# Patient Record
Sex: Female | Born: 1975 | Race: White | Hispanic: No | Marital: Married | Smoking: Never smoker
Health system: Southern US, Community
[De-identification: ages and names within clinical notes are randomized; demographics above are authoritative.]

## PROBLEM LIST (undated history)

## (undated) DIAGNOSIS — N390 Urinary tract infection, site not specified: Secondary | ICD-10-CM

---

## 2000-06-14 ENCOUNTER — Other Ambulatory Visit: Admission: RE | Admit: 2000-06-14 | Discharge: 2000-06-14 | Payer: Self-pay | Admitting: Obstetrics and Gynecology

## 2001-07-24 ENCOUNTER — Other Ambulatory Visit: Admission: RE | Admit: 2001-07-24 | Discharge: 2001-07-24 | Payer: Self-pay | Admitting: Obstetrics and Gynecology

## 2002-05-14 ENCOUNTER — Emergency Department (HOSPITAL_COMMUNITY): Admission: EM | Admit: 2002-05-14 | Discharge: 2002-05-14 | Payer: Self-pay | Admitting: Emergency Medicine

## 2002-05-14 ENCOUNTER — Encounter: Payer: Self-pay | Admitting: Emergency Medicine

## 2002-08-28 ENCOUNTER — Other Ambulatory Visit: Admission: RE | Admit: 2002-08-28 | Discharge: 2002-08-28 | Payer: Self-pay | Admitting: Obstetrics and Gynecology

## 2004-01-26 ENCOUNTER — Other Ambulatory Visit: Admission: RE | Admit: 2004-01-26 | Discharge: 2004-01-26 | Payer: Self-pay | Admitting: Family Medicine

## 2004-05-17 ENCOUNTER — Emergency Department (HOSPITAL_COMMUNITY): Admission: EM | Admit: 2004-05-17 | Discharge: 2004-05-18 | Payer: Self-pay | Admitting: Emergency Medicine

## 2004-07-08 ENCOUNTER — Ambulatory Visit (HOSPITAL_COMMUNITY): Admission: RE | Admit: 2004-07-08 | Discharge: 2004-07-08 | Payer: Self-pay | Admitting: Obstetrics & Gynecology

## 2004-07-16 ENCOUNTER — Encounter (INDEPENDENT_AMBULATORY_CARE_PROVIDER_SITE_OTHER): Payer: Self-pay | Admitting: Specialist

## 2004-07-16 ENCOUNTER — Ambulatory Visit: Payer: Self-pay | Admitting: Obstetrics & Gynecology

## 2004-07-16 ENCOUNTER — Ambulatory Visit (HOSPITAL_COMMUNITY): Admission: RE | Admit: 2004-07-16 | Discharge: 2004-07-16 | Payer: Self-pay | Admitting: Obstetrics & Gynecology

## 2004-08-03 ENCOUNTER — Ambulatory Visit: Payer: Self-pay | Admitting: Obstetrics & Gynecology

## 2005-02-28 ENCOUNTER — Inpatient Hospital Stay (HOSPITAL_COMMUNITY): Admission: AD | Admit: 2005-02-28 | Discharge: 2005-02-28 | Payer: Self-pay | Admitting: Obstetrics and Gynecology

## 2005-05-14 ENCOUNTER — Inpatient Hospital Stay (HOSPITAL_COMMUNITY): Admission: AD | Admit: 2005-05-14 | Discharge: 2005-05-15 | Payer: Self-pay | Admitting: Obstetrics and Gynecology

## 2005-05-16 ENCOUNTER — Inpatient Hospital Stay (HOSPITAL_COMMUNITY): Admission: AD | Admit: 2005-05-16 | Discharge: 2005-05-18 | Payer: Self-pay | Admitting: Obstetrics and Gynecology

## 2005-05-16 ENCOUNTER — Encounter (INDEPENDENT_AMBULATORY_CARE_PROVIDER_SITE_OTHER): Payer: Self-pay | Admitting: Specialist

## 2005-05-19 ENCOUNTER — Encounter: Admission: RE | Admit: 2005-05-19 | Discharge: 2005-06-18 | Payer: Self-pay | Admitting: Obstetrics and Gynecology

## 2005-06-24 ENCOUNTER — Other Ambulatory Visit: Admission: RE | Admit: 2005-06-24 | Discharge: 2005-06-24 | Payer: Self-pay | Admitting: Obstetrics and Gynecology

## 2007-01-17 ENCOUNTER — Other Ambulatory Visit: Admission: RE | Admit: 2007-01-17 | Discharge: 2007-01-17 | Payer: Self-pay | Admitting: Family Medicine

## 2007-09-14 ENCOUNTER — Inpatient Hospital Stay (HOSPITAL_COMMUNITY): Admission: AD | Admit: 2007-09-14 | Discharge: 2007-09-14 | Payer: Self-pay | Admitting: Obstetrics and Gynecology

## 2007-09-26 ENCOUNTER — Inpatient Hospital Stay (HOSPITAL_COMMUNITY): Admission: AD | Admit: 2007-09-26 | Discharge: 2007-09-28 | Payer: Self-pay | Admitting: Obstetrics and Gynecology

## 2007-09-26 ENCOUNTER — Encounter (INDEPENDENT_AMBULATORY_CARE_PROVIDER_SITE_OTHER): Payer: Self-pay | Admitting: Obstetrics and Gynecology

## 2010-02-28 ENCOUNTER — Encounter: Payer: Self-pay | Admitting: Obstetrics & Gynecology

## 2010-06-22 NOTE — H&P (Signed)
[name], [name]                  ACCOUNT NO.:  [id number]   MEDICAL RECORD NO.:  [id number]           PATIENT TYPE:   LOCATION:                                 FACILITY:   PHYSICIAN:  Marie L. Williams, C.N.M.DATE OF BIRTH:  07/08/1975   DATE OF ADMISSION:  DATE OF DISCHARGE:                              HISTORY & PHYSICAL   This is a 35 year old gravida 3, para 1-0-1-1 at 38-5/7 weeks who  presents with complaints of contractions.  She denies leaking or  bleeding and reports positive fetal movement.  Pregnancy has been  followed by the MD Service and remarkable for;  1. 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  2. Rh negative.  3. Rubella nonimmune.  4. Group B strep negative.   ALLERGIES:  ERYTHROMYCIN.   OB HISTORY:  Remarkable for spontaneous abortion in 2006 and a vaginal  delivery in 2007 of female infant at [redacted] weeks gestation, weighing 6  pounds 13 ounces.  Remarkable for chronic fetal maternal hemorrhage.   MEDICAL HISTORY:  1. Remarkable for Rh negative.  2. Childhood varicella.  3. Rubella nonimmune.   SURGICAL HISTORY:  Remarkable for rhinoplasty in 1999 and D&C in 2006.   FAMILY HISTORY:  Remarkable for grandmother with MI, grandfather with  MI, grandmother and mother with hypertension, mother with thyroid cancer  and a grandmother with pancreatic cancer.   GENETIC HISTORY:  Remarkable for family history of twins.   SOCIAL HISTORY:  The patient is married to [employer] who is involved  and supportive.  She is of the [place] faith.  She denies any alcohol,  tobacco, or drug use.   PRENATAL LABS:  Hemoglobin 13.5, platelets 234, blood type O negative,  antibody screen negative, RPR nonreactive, rubella nonimmune, hepatitis  negative, HIV negative.  Pap test normal.  Cultures declined.   HISTORY OF CURRENT PREGNANCY:  The patient entered care at [redacted] weeks  gestation.  She declined first-trimester screen.  She had ultrasound at  18  weeks that was normal.  She was treated for UTI at that time.  Glucola at 28 weeks was normal.  She had an ultrasound at 32 weeks that  showed 70th percentile growth and normal AFI, and she was group B strep  negative at term.   OBJECTIVE DATA:  VITAL SIGNS:  Stable.  Afebrile.  HEENT:  Within normal limits.  Thyroid normal, not enlarged.  CHEST:  Clear to auscultation.  HEART:  Regular rate and rhythm.  ABDOMEN:  Gravid.  Vertex to Leopold's exam shows fetal heart rate  reactive with contractions every 2-4 minutes.  Cervix was initially 1 cm  and now 4 cm, 80% effaced.  EXTREMITIES:  Within normal limits.   ASSESSMENT:  1. Intrauterine pregnancy at 38-5/7 weeks.  2. Active labor.   PLAN:  1. Admit per Dr. Su Hilt.  2. Routine MD orders.  3. Epidural p.r.n.      Marie L. Mayford Knife, C.N.M.     MLW/MEDQ  D:  09/26/2007  T:  09/26/2007  Job:  [id number]

## 2010-06-25 NOTE — Op Note (Signed)
[name], [name]                  ACCOUNT NO.:  [id number]   MEDICAL RECORD NO.:  [id number]          PATIENT TYPE:  AMB   LOCATION:  SDC                           [facility]  [facility]   PHYSICIAN:  Lesly Dukes, M.D. DATE OF BIRTH:  1975-10-17   DATE OF PROCEDURE:  07/16/2004  DATE OF DISCHARGE:                                 OPERATIVE REPORT       KHL/MEDQ  D:  07/16/2004  T:  07/16/2004  Job:  [id number]

## 2010-06-25 NOTE — Op Note (Signed)
[name], [name] NO.:  [id number]   MEDICAL RECORD NO.:  [id number]          PATIENT TYPE:  AMB   LOCATION:  SDC                           [facility]   PHYSICIAN:  Lesly Dukes, M.D. DATE OF BIRTH:  Nov 29, 1975   DATE OF PROCEDURE:  07/16/2004  DATE OF DISCHARGE:                                 OPERATIVE REPORT   PREOPERATIVE DIAGNOSIS:  A 35 year old female with 9 week missed abortion.   POSTOPERATIVE DIAGNOSIS:  A 35 year old female with 9 week missed abortion.   PROCEDURE:  Dilatation with vacuum curettage.   SURGEON:  Dr. Elsie Lincoln   ASSISTANT:  None.   ANESTHESIA:  MAC and local.   PATHOLOGY:  Endometrial curettings.   ESTIMATED BLOOD LOSS:  Minimal.   COMPLICATIONS:  None.   FINDINGS:  Approximately 9 week size uterus prior to the exam.  Postprocedural ultrasound of the uterus is empty and retroverted.   DESCRIPTION OF PROCEDURE:  After informed consent was obtained, the patient  was taken to the operating room where MAC anesthesia was induced.  The  patient was placed in dorsolithotomy position and prepared and draped in  normal sterile fashion.  Bladder was in-and-out catheterized.  A bimanual  revealed the above findings.  A bivalve speculum was placed into the  patient's vagina, and the anterior lip of the cervix was grasped with a  single-tooth tenaculum.  The cervix was infiltrated at 3 and 9 o'clock to  aid anesthesia.  Approximately 20 mL of lidocaine was used total.  The  cervix was then gently dilated with Hegar dilators to a #10.  Then a #9  curved suction curette was gently introduced into the uterus, and gentle  suction curettage was performed.  The suction curette was removed, and a  sharp curette was gently introduced into the uterus, and gentle sharp  curettage was performed.  A cry was felt on all sides.  One last pass of the  suction curette was then done to ensure that all POC was removed from the  uterus.   Ultrasound abdominally at the end of the procedure revealed an  empty uterus.  The cervix was hemostatic.  The patient tolerated the  procedure well.  Sponge, lap, needle, and instrument counts were correct x  2.  The patient went to the recovery room in stable condition.     KHL/MEDQ  D:  07/16/2004  T:  07/16/2004  Job:  [id number]

## 2010-06-25 NOTE — H&P (Signed)
[name], [name]                  ACCOUNT NO.:  [id number]   MEDICAL RECORD NO.:  [id number]          PATIENT TYPE:  INP   LOCATION:  9105                          [facility]ILITY:  [facility]   PHYSICIAN:  Crist Fat. Rivard, M.D. DATE OF BIRTH:  06-Jan-1976   DATE OF ADMISSION:  05/16/2005  DATE OF DISCHARGE:                                HISTORY & PHYSICAL   HISTORY OF PRESENT ILLNESS:  The patient is a 35 year old, gravida 2, para 0-  0-1-0, who is admitted at 39 weeks 2 days gestation with complaints of  painful regular uterine contractions every three minutes since 11 a.m. on  the day of admission. The patient denies rupture of membranes or bleeding.  The patient reports that her fetus is moving normally. The patient denies  headaches, vision changes, right upper quadrant pain, or other toxic signs  and symptoms.   The patient's pregnancy is remarkable for:  1.  Rh negative.  2.  Rubella nonimmune.   PRENATAL LABORATORIES:  Initial hemoglobin 13.2, platelet 248,000. Blood  type O negative. Antibody screen negative. RPR nonreactive. Rubella titer  negative. Hepatitis B surface antigen negative. HIV nonreactive. Gonorrhea  and chlamydia negative times two.  Pap smear in December 2005 was within  normal limits. Cystic fibrosis testing declined. First trimester genetic  screening declined. Quad screen declined. Second trimester hemoglobin 12.1.  Glucola 119. Second trimester RPR nonreactive. Group strep at 35 weeks  negative. GC and chlamydia cultures declined.   HISTORY OF PRESENT PREGNANCY:  The patient entered pregnancy at [redacted] weeks  gestation. The patient's pregnancy has been followed at the MD service at  [facility]. The patient's last menstrual period was August 14, 2004, [facility] May 21, 2005, with first trimester ultrasound confirming EDC.  Ultrasound done at 18 weeks for anatomy consistent with previous dating.  Cervix 4.0 cm. Patient with upper respiratory tract infection at  24 weeks,  resolving spontaneously. Glucola at 27 weeks with above noted result.  Patient with complaints of low back pain at that time. The patient was given  RhoGAM prescription at that time. The patient received RhoGAM at 28 weeks at  the [facility]. The patient complains of left scapular pain  at 29 weeks, treated with conservative management. The patient was noted to  have weight loss at 33 weeks. However, patient with no nausea and vomiting.  The patient declined referral to see [facility] for physical therapy to treat her  left scapular pain and reported pain only after sitting for long periods of  time. The patient declined abdominal ultrasound for evaluation of left  scapular and upper abdominal pain at 35 weeks. The patient was treated for  vulvovaginal candidiasis at 36 weeks with Terazol 7 . Patient with  complaints of decreased fetal movement at 37 weeks at which time nonstress  test was nonreactive and biophysical profile 8/10. Amniotic fluid index at  37 weeks within normal limits at 15.8 cm. At 38 weeks the patient reported  with fetal movements at normal levels. The patient was seen in [facility] on May 14, 2005, with complaints of uterine contractions at which time patient with  nonreactive NST and a biophysical profile of 6/10. Amniotic fluid index 14.6  cm and occasional variable decelerations. No cervical change and therefore  was discharged home. The patient presented with regular uterine contractions  on the day of admission.   OBSTETRIC HISTORY:  Pregnancy #1--June 2006 had spontaneous AB at [redacted] weeks  gestation; D&C done on July 16, 2004, no complications.  Pregnancy #2--is  present.   GYNECOLOGIC HISTORY:  Patient with no history of abnormal Pap smear or STDs.  The patient has used OCPs in the past with discontinuation in March 2006.  Patient with regular monthly menses every 28 days with no abnormalities.   PAST MEDICAL HISTORY:  Rhinoplasty in December  1999.   MEDICATIONS:  Prenatal vitamins.   ALLERGIES:  ERYTHROMYCIN.   FAMILY HISTORY:  Maternal grandfather with coronary artery disease. Maternal  grandmother with hypertension, pancreatic cancer. Mother with chronic  hypertension, varicose veins, thyroid cancer. Otherwise negative.   GENETIC HISTORY:  Negative.   SOCIAL HISTORY:  The patient is married to the father of the baby, Gerlene Burdock,  who is involved and supportive. 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 Father of the baby has 12 years of education and is in radio  sales. The patient and her husband are [place] in their faith. The patient  denies alcohol, tobacco, or street drug use. The patient's domestic violence  screen is negative.   PHYSICAL EXAMINATION:  VITAL SIGNS: The patient is afebrile. Vital signs are  stable.  HEENT: Within normal limits.  LUNGS: Clear.  HEART: Regular rate and rhythm.  BREASTS: Soft.  ABDOMEN: Soft, gravid, and nontender with fundal height extending 38 cm  above the symphysis pubis. Fetus is noted in longitudinal lie in vertex to  Leopold's maneuvers. Fetal heart rate in the 160s.  PELVIC EXAM: Digital exam of cervix finds the cervix 4 cm dilated, 90%  effaced, -1 station, vertex presentation.  EXTREMITIES: Trace edema noted to be present. Negative Homan sign  bilaterally. Deep tendon reflexes are 2+ with no clonus.   ASSESSMENT:  1.  Intrauterine pregnancy at 39-2/[redacted] weeks gestation.  2.  Early active labor.   PLAN:  1.  The patient will be admitted into birthing suite for consult with Dr.      Estanislado Pandy.  2.  Routine MD orders. Further orders per MD for pain medication.      Rhona Leavens, CNM      Crist Fat Rivard, M.D.  Electronically Signed    NOS/MEDQ  D:  05/16/2005  T:  05/16/2005  Job:  [id number]

## 2010-06-25 NOTE — Group Therapy Note (Signed)
NAME:  [name], [name] NO.:  [id number]   MEDICAL RECORD NO.:  [id number]          PATIENT TYPE:  WOC   LOCATION:  [facility]                   FACILITY:  [facility]   PHYSICIAN:  Elsie Lincoln, MD      DATE OF BIRTH:  06-16-1975   DATE OF SERVICE:  08/03/2004                                    CLINIC NOTE   The patient is a 35 year old female, who underwent D&C by myself for a  missed AB on July 16, 2004.  The patient has been asymptomatic since.  Products of conception were noted on pathology.  She is no longer bleeding  and has not had her menses yet.  The patient does desire to become pregnant  again.  She is encouraged to have two normal periods.  Also, she is supposed  to continue her prenatal vitamins every day while trying to become pregnant.  The patient encouraged to seek early prenatal care.  She does not have any  history of genetic abnormality on either side of the family.   PHYSICAL EXAMINATION:  VITAL SIGNS:  Temperature 97.3, pulse 75, blood  pressure 111/70, weight 155.5, height 5 feet 8 inches.  ABDOMEN:  Soft, nontender, nondistended.  GENITALIA:  10/5.  Cervix closed, nontender.  Uterus anterior, nontender.  Adnexa no masses, nontender.   This is a 35 year old female, status post D&C with PSE on pathology.  Preconceptual counseling done.  The patient is to either go to the health  department or call private OB/gyn with a positive pregnancy test.  Continue  prenatal vitamins.       KL/MEDQ  D:  08/03/2004  T:  08/03/2004  Job:  [id number]

## 2019-11-03 ENCOUNTER — Encounter (HOSPITAL_BASED_OUTPATIENT_CLINIC_OR_DEPARTMENT_OTHER): Payer: Self-pay | Admitting: Emergency Medicine

## 2019-11-03 ENCOUNTER — Other Ambulatory Visit: Payer: Self-pay

## 2019-11-03 ENCOUNTER — Emergency Department (HOSPITAL_BASED_OUTPATIENT_CLINIC_OR_DEPARTMENT_OTHER)
Admission: EM | Admit: 2019-11-03 | Discharge: 2019-11-03 | Disposition: A | Discharge status: Home / Self Care | Payer: Self-pay | Attending: Emergency Medicine | Admitting: Emergency Medicine

## 2019-11-03 ENCOUNTER — Emergency Department (HOSPITAL_BASED_OUTPATIENT_CLINIC_OR_DEPARTMENT_OTHER): Payer: Self-pay

## 2019-11-03 DIAGNOSIS — Z8616 Personal history of COVID-19: Secondary | ICD-10-CM | POA: Insufficient documentation

## 2019-11-03 DIAGNOSIS — R1084 Generalized abdominal pain: Secondary | ICD-10-CM | POA: Insufficient documentation

## 2019-11-03 HISTORY — DX: Urinary tract infection, site not specified: N39.0

## 2019-11-03 LAB — URINALYSIS, ROUTINE W REFLEX MICROSCOPIC
Bilirubin Urine: NEGATIVE
Glucose, UA: NEGATIVE mg/dL
Hgb urine dipstick: NEGATIVE
Ketones, ur: NEGATIVE mg/dL
Leukocytes,Ua: NEGATIVE
Nitrite: NEGATIVE
Protein, ur: NEGATIVE mg/dL
Specific Gravity, Urine: 1.02 (ref 1.005–1.030)
pH: 8 (ref 5.0–8.0)

## 2019-11-03 LAB — CBC WITH DIFFERENTIAL/PLATELET
Abs Immature Granulocytes: 0.02 10*3/uL (ref 0.00–0.07)
Basophils Absolute: 0.1 10*3/uL (ref 0.0–0.1)
Basophils Relative: 2 %
Eosinophils Absolute: 0.1 10*3/uL (ref 0.0–0.5)
Eosinophils Relative: 1 %
HCT: 40.8 % (ref 36.0–46.0)
Hemoglobin: 13.5 g/dL (ref 12.0–15.0)
Immature Granulocytes: 0 %
Lymphocytes Relative: 19 %
Lymphs Abs: 1.3 10*3/uL (ref 0.7–4.0)
MCH: 30.8 pg (ref 26.0–34.0)
MCHC: 33.1 g/dL (ref 30.0–36.0)
MCV: 93.2 fL (ref 80.0–100.0)
Monocytes Absolute: 0.7 10*3/uL (ref 0.1–1.0)
Monocytes Relative: 10 %
Neutro Abs: 4.6 10*3/uL (ref 1.7–7.7)
Neutrophils Relative %: 68 %
Platelets: 266 10*3/uL (ref 150–400)
RBC: 4.38 MIL/uL (ref 3.87–5.11)
RDW: 12.8 % (ref 11.5–15.5)
WBC: 6.7 10*3/uL (ref 4.0–10.5)
nRBC: 0 % (ref 0.0–0.2)

## 2019-11-03 LAB — COMPREHENSIVE METABOLIC PANEL
ALT: 14 U/L (ref 0–44)
AST: 16 U/L (ref 15–41)
Albumin: 4.2 g/dL (ref 3.5–5.0)
Alkaline Phosphatase: 45 U/L (ref 38–126)
Anion gap: 12 (ref 5–15)
BUN: 14 mg/dL (ref 6–20)
CO2: 25 mmol/L (ref 22–32)
Calcium: 9.3 mg/dL (ref 8.9–10.3)
Chloride: 101 mmol/L (ref 98–111)
Creatinine, Ser: 0.84 mg/dL (ref 0.44–1.00)
GFR calc Af Amer: >60 mL/min (ref >60)
GFR calc non Af Amer: >60 mL/min (ref >60)
Glucose, Bld: 103 mg/dL — ABNORMAL HIGH (ref 70–99)
Potassium: 3.8 mmol/L (ref 3.5–5.1)
Sodium: 138 mmol/L (ref 135–145)
Total Bilirubin: 0.3 mg/dL (ref 0.3–1.2)
Total Protein: 7.5 g/dL (ref 6.5–8.1)

## 2019-11-03 LAB — LIPASE, BLOOD: Lipase: 34 U/L (ref 11–51)

## 2019-11-03 LAB — PREGNANCY, URINE: Preg Test, Ur: NEGATIVE

## 2019-11-03 MED ORDER — IOHEXOL 300 MG/ML  SOLN
100.0000 mL | Freq: Once | INTRAMUSCULAR | Status: AC | PRN
  Administered 2019-11-03: 100 mL via INTRAVENOUS

## 2019-11-03 NOTE — ED Triage Notes (Signed)
Abdominal pain for 2 days, UTI . Tx started 09/21 by PMD. VSS. Marland Kitchen Denies n/vd.

## 2019-11-03 NOTE — ED Notes (Signed)
ED Provider at bedside. 

## 2019-11-03 NOTE — ED Provider Notes (Signed)
[facility] EMERGENCY DEPARTMENT Provider Note   CSN: [id number] Arrival date & time: 11/03/19  1435     History Chief Complaint  Patient presents with  . Abdominal Pain    [name] is a 44 y.o. female.  HPI Patient with abdominal pain for the last few days.  States around a month ago she had Covid and had diarrhea followed by constipation with that.  Then later began to have abdominal pain.  Some in the right flank and up to the upper abdomen but does go throughout the abdomen.  Had been seen by PCP and was given Cipro for possible UTI.  Reportedly had white cells and blood and ketones in the urine.  Went to urgent care today for continued abdominal pain.  Sent in because potentially would need CT scan to evaluate for appendicitis.  No fevers.  States she still feels a little off.  States she is on the last day of the Cipro.  States she was having some urinary frequency and still feels that she has to go more often.  No vaginal discharge.  Denies pregnancy.    Past Medical History:  Diagnosis Date  . UTI (urinary tract infection)     There are no problems to display for this patient.   History reviewed. No pertinent surgical history.   OB History   No obstetric history on file.     No family history on file.  Social History   Tobacco Use  . Smoking status: Never Smoker  . Smokeless tobacco: Never Used  Substance Use Topics  . Alcohol use: Not on file  . Drug use: Not on file    Home Medications Prior to Admission medications   Medication Sig Start Date End Date Taking? Authorizing Provider  ciprofloxacin (CIPRO) 500 MG tablet Take 500 mg by mouth 2 (two) times daily. 10/29/19 11/03/19 Yes [provider]    Allergies    Flagyl [metronidazole] and Erythromycin base  Review of Systems   Review of Systems  Constitutional: Positive for fatigue. Negative for appetite change.  HENT: Negative for congestion.   Respiratory: Negative for  shortness of breath.   Gastrointestinal: Positive for abdominal pain.  Genitourinary: Positive for flank pain and frequency.  Musculoskeletal: Negative for back pain.  Neurological: Negative for weakness.  Psychiatric/Behavioral: Negative for confusion.    Physical Exam Updated Vital Signs BP 130/78 (BP Location: Left Arm)   Pulse 78   Temp 98.2 F (36.8 C) (Oral)   Resp 16   Ht 5\' 7"  (1.702 m)   Wt 73.5 kg   LMP 10/23/2019 (Exact Date)   SpO2 100%   BMI 25.37 kg/m   Physical Exam Vitals reviewed.  HENT:     Head: Normocephalic.  Pulmonary:     Breath sounds: Normal breath sounds.  Abdominal:     Hernia: No hernia is present.     Comments: Mild diffuse abdominal tenderness.  May be worse on right side but somewhat difficult to localize.  No hernia palpated.  No mass.  Skin:    General: Skin is warm.     Capillary Refill: Capillary refill takes less than 2 seconds.  Neurological:     Mental Status: She is alert and oriented to person, place, and time.  Psychiatric:        Mood and Affect: Mood normal.     ED Results / Procedures / Treatments   Labs (all labs ordered are listed, but only abnormal results are displayed)  Labs Reviewed  URINALYSIS, ROUTINE W REFLEX MICROSCOPIC - Abnormal; Notable for the following components:      Result Value   APPearance CLOUDY (*)    All other components within normal limits  COMPREHENSIVE METABOLIC PANEL - Abnormal; Notable for the following components:   Glucose, Bld 103 (*)    All other components within normal limits  PREGNANCY, URINE  LIPASE, BLOOD  CBC WITH DIFFERENTIAL/PLATELET    EKG None  Radiology CT ABDOMEN PELVIS W CONTRAST  Result Date: 11/03/2019 CLINICAL DATA:  Abdominal pain. Right lower quadrant pain and upper abdominal pain. EXAM: CT ABDOMEN AND PELVIS WITH CONTRAST TECHNIQUE: Multidetector CT imaging of the abdomen and pelvis was performed using the standard protocol following bolus administration of  intravenous contrast. CONTRAST:  OMNIPAQUE IOHEXOL 300 MG/ML  SOLN COMPARISON:  None. FINDINGS: Lower chest: The lung bases are clear. The heart size is normal. Hepatobiliary: The liver is normal. Normal gallbladder.There is no biliary ductal dilation. Pancreas: Normal contours without ductal dilatation. No peripancreatic fluid collection. Spleen: Unremarkable. Adrenals/Urinary Tract: --Adrenal glands: Unremarkable. --Right kidney/ureter: There is a punctate nonobstructing stone in the interpolar region of the right kidney. There is a punctate nonobstructing stone in lower pole. --Left kidney/ureter: There is a small cyst arising from the upper pole. There is no hydronephrosis. --Urinary bladder: The urinary bladder is decompressed which significantly limits evaluation. There are multiple calcifications in the patient's pelvis that are favored to represent phleboliths. Stomach/Bowel: --Stomach/Duodenum: No hiatal hernia or other gastric abnormality. Normal duodenal course and caliber. --Small bowel: Unremarkable. --Colon: Unremarkable. --Appendix: The appendix appears to be normal (coronal series 5, image 29 Vascular/Lymphatic: Normal course and caliber of the major abdominal vessels. --No retroperitoneal lymphadenopathy. --No mesenteric lymphadenopathy. --No pelvic or inguinal lymphadenopathy. Reproductive: Unremarkable Other: There is a small volume of pelvic free fluid which is likely physiologic. No free air. The abdominal wall is normal. Musculoskeletal. No acute displaced fractures. IMPRESSION: 1. No acute abdominopelvic abnormality. 2. Nonobstructive right nephrolithiasis. 3. Normal appendix. 4. Small volume of pelvic free fluid is likely physiologic. Electronically Signed   By: Katherine Mantle M.D.   On: 11/03/2019 18:32    Procedures Procedures (including critical care time)  Medications Ordered in ED Medications  iohexol (OMNIPAQUE) 300 MG/ML solution 100 mL (100 mLs Intravenous Contrast  Given 11/03/19 1806)    ED Course  I have reviewed the triage vital signs and the nursing notes.  Pertinent labs & imaging results that were available during my care of the patient were reviewed by me and considered in my medical decision making (see chart for details).    MDM Rules/Calculators/A&P                          Patient with abdominal pain.  Seen at urgent care and sent in for possible CT scan due to right lower quadrant pain.  Lab work reassuring.  Has had some symptoms since she had Covid about a month ago.  Discussed with patient and with the right lower quadrant tenderness despite blood work being reassuring we did make the decision to have a CT scan to further evaluate.  CT scan was reassuring overall.  Informed of kidney stone that was nonobstructing and I think not causing the pain.  Will discharge home with outpatient follow-up as needed. Final Clinical Impression(s) / ED Diagnoses Final diagnoses:  Generalized abdominal pain    Rx / DC Orders ED Discharge Orders    None  Benjiman Core, MD 11/03/19 [phone]

## 2021-09-08 IMAGING — CT CT ABD-PELV W/ CM
2 of 5 series · 16 of 46 positions shown, 18 images · IV contrast (omnipaque)
Comparison: None.

CLINICAL DATA: Abdominal pain. Right lower quadrant pain and upper
abdominal pain.

EXAM:
CT ABDOMEN AND PELVIS WITH CONTRAST
TECHNIQUE: Multidetector CT imaging of the abdomen and pelvis was performed
using the standard protocol following bolus administration of
intravenous contrast.
CONTRAST:  100mL OMNIPAQUE IOHEXOL 300 MG/ML  SOLN

[Series 2: axial st · axial · 0.98mm/px · z∈[-440,-56]mm · 13 of 87 slices shown, 15 images]
[im 5/87  soft-tissue]
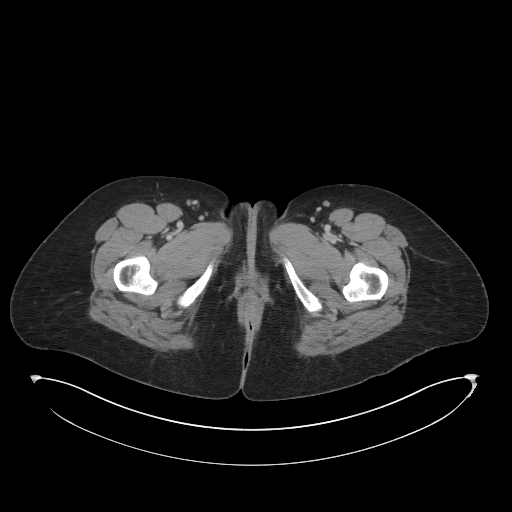
[im 5/87  bone]
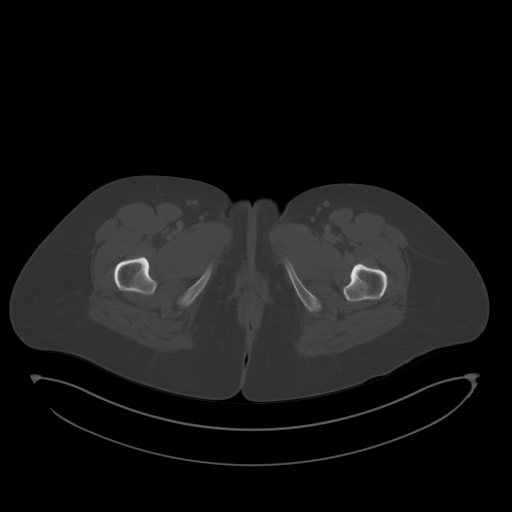
[im 14/87  soft-tissue]
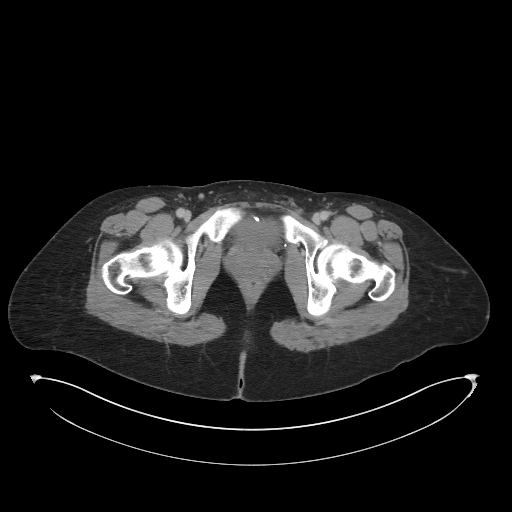
[im 19/87  soft-tissue]
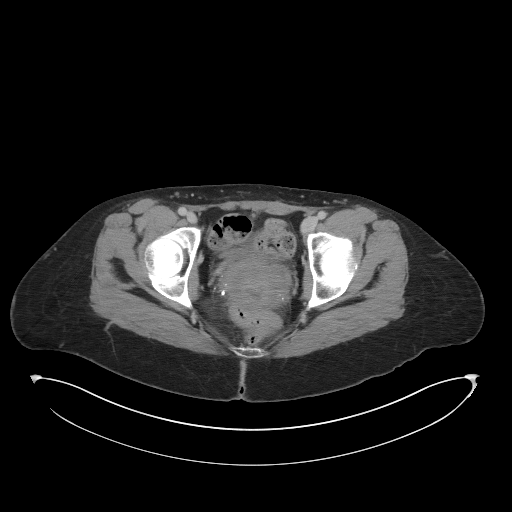
[im 23/87  soft-tissue]
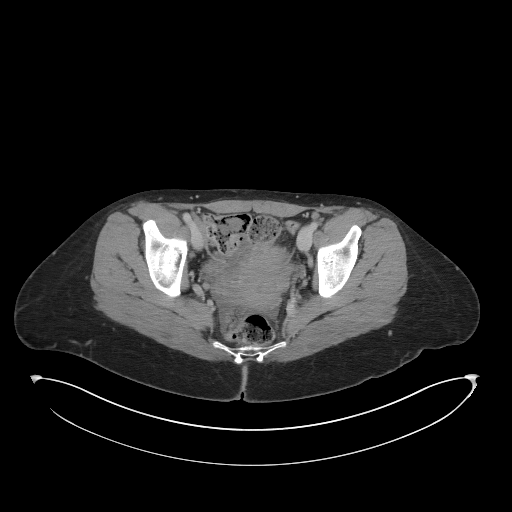
[im 32/87  soft-tissue]
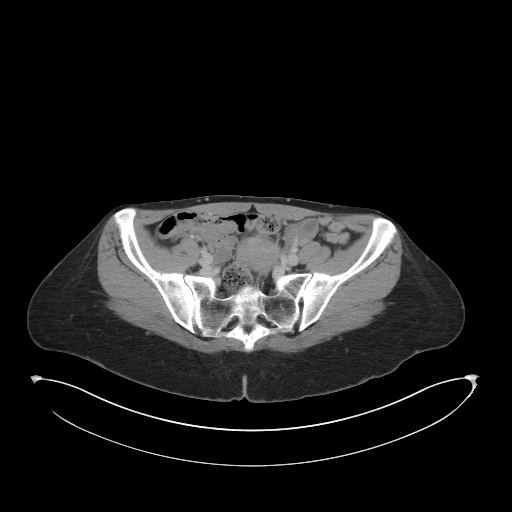
[im 37/87  soft-tissue]
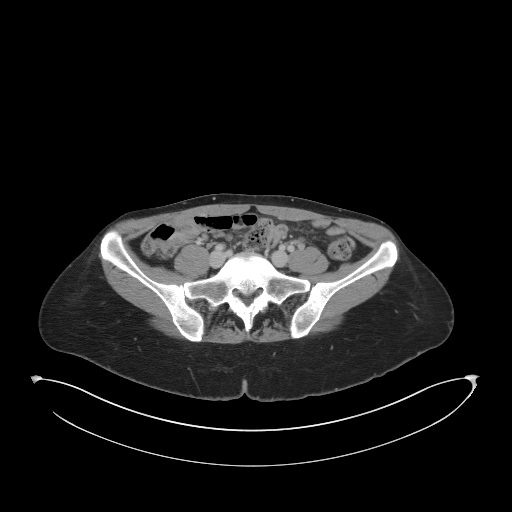
[im 46/87  soft-tissue]
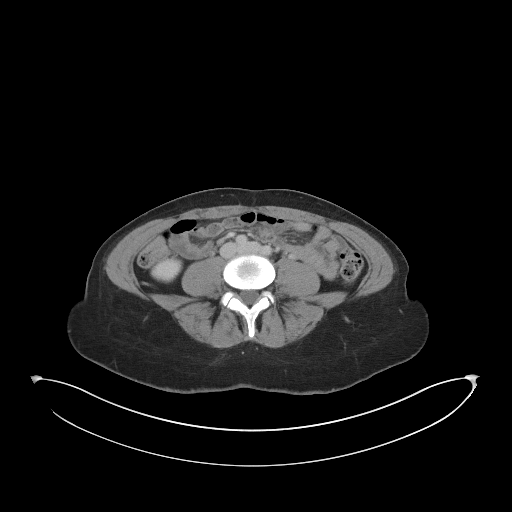
[im 50/87  soft-tissue]
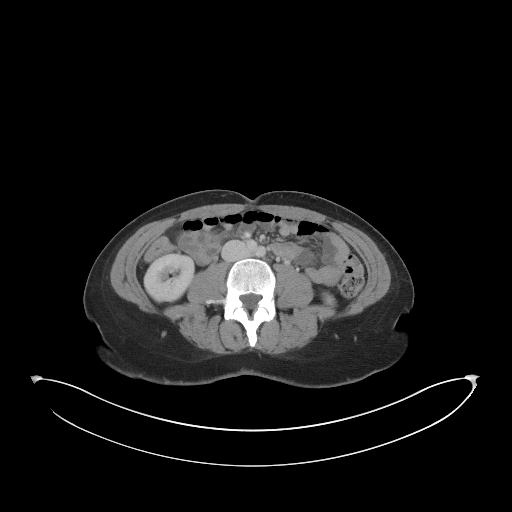
[im 55/87  soft-tissue]
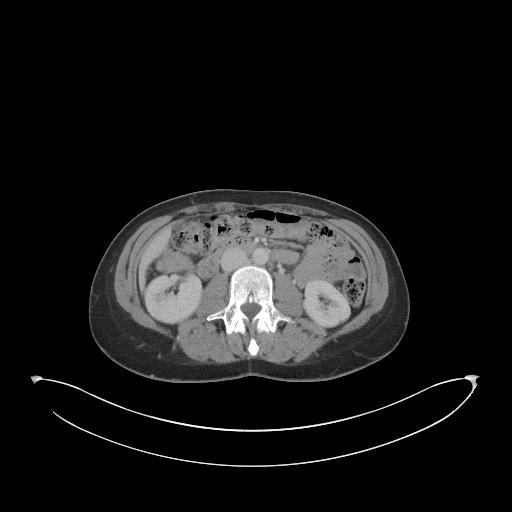
[im 55/87  bone]
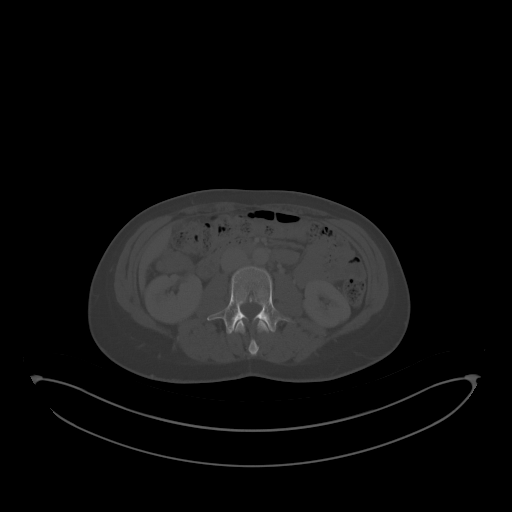
[im 64/87  soft-tissue]
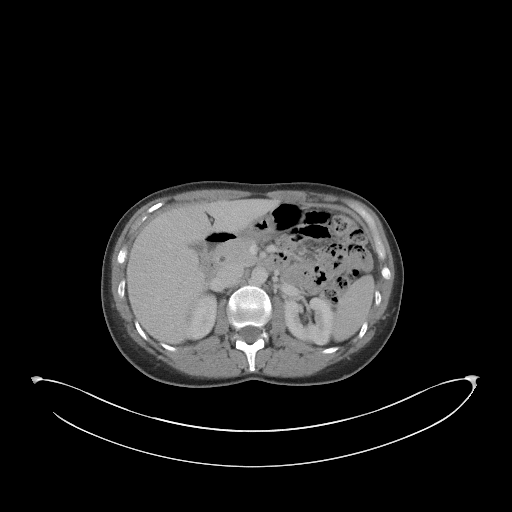
[im 68/87  soft-tissue]
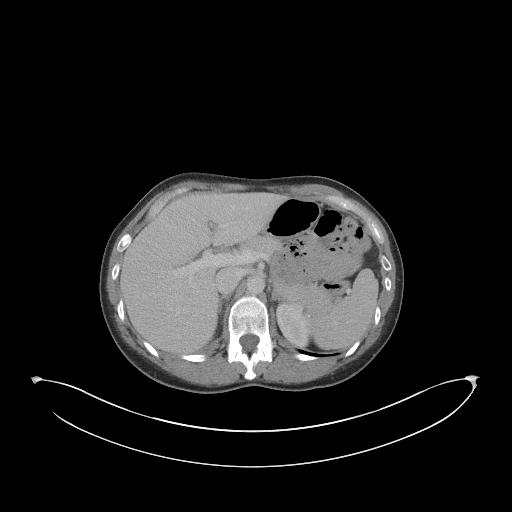
[im 73/87  soft-tissue]
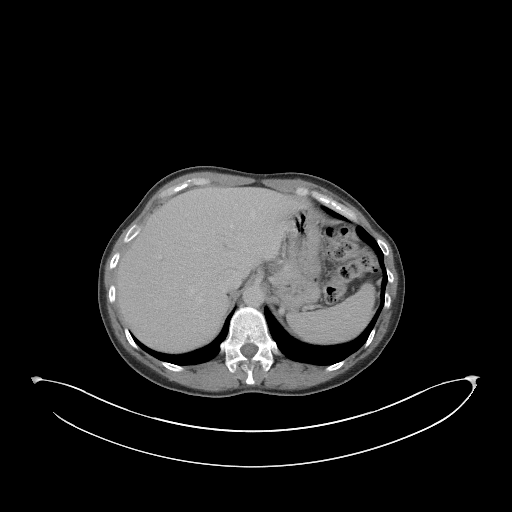
[im 82/87  soft-tissue]
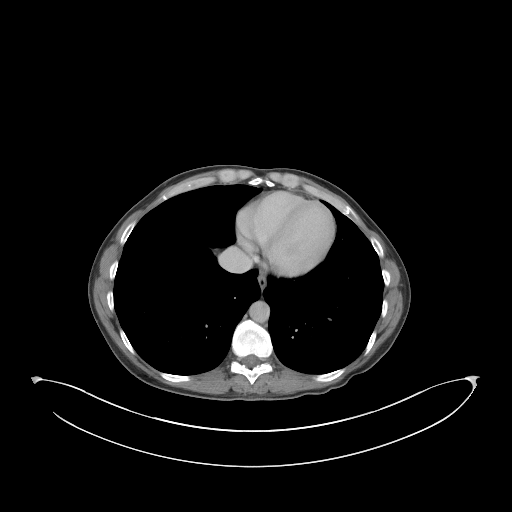

[Series 5: coronal st · coronal · 0.82mm/px · 3 of 79 slices shown]
[im 27/79  soft-tissue]
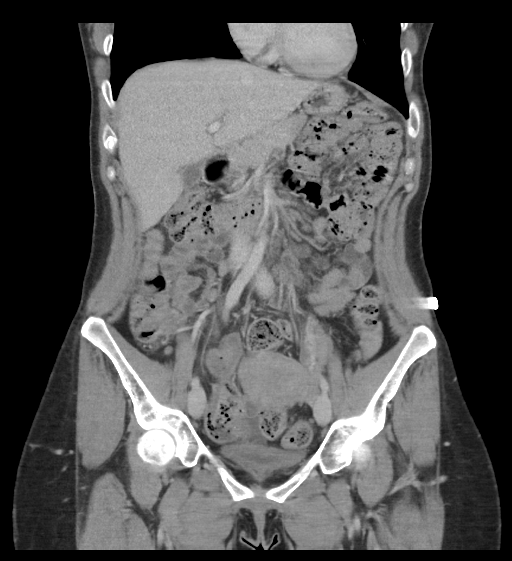
[im 35/79  soft-tissue]
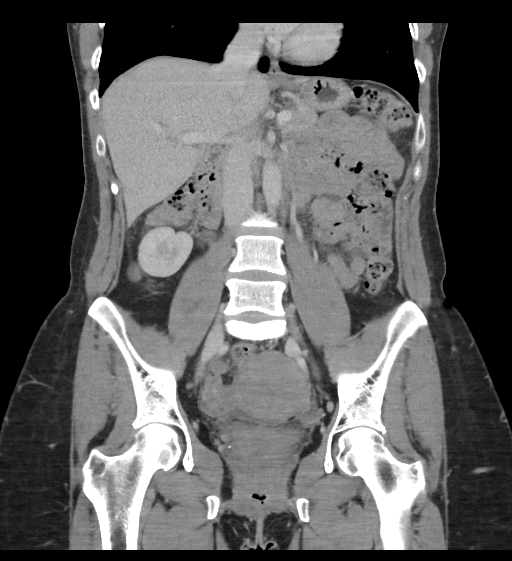
[im 44/79  soft-tissue]
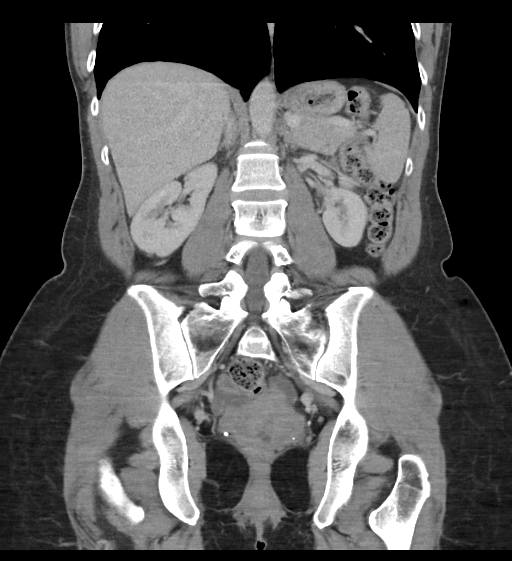

[16 of 46 positions shown; findings below may reference images not displayed]

FINDINGS: Lower chest: The lung bases are clear. The heart size is normal.

Hepatobiliary: The liver is normal. Normal gallbladder.There is no
biliary ductal dilation.

Pancreas: Normal contours without ductal dilatation. No
peripancreatic fluid collection.

Spleen: Unremarkable.

Adrenals/Urinary Tract:

--Adrenal glands: Unremarkable.

--Right kidney/ureter: There is a punctate nonobstructing stone in
the interpolar region of the right kidney. There is a punctate
nonobstructing stone in lower pole.

--Left kidney/ureter: There is a small cyst arising from the upper
pole. There is no hydronephrosis.

--Urinary bladder: The urinary bladder is decompressed which
significantly limits evaluation. There are multiple calcifications
in the patient's pelvis that are favored to represent phleboliths.

Stomach/Bowel:

--Stomach/Duodenum: No hiatal hernia or other gastric abnormality.
Normal duodenal course and caliber.

--Small bowel: Unremarkable.

--Colon: Unremarkable.

--Appendix: The appendix appears to be normal (coronal series 5,
image 29

Vascular/Lymphatic: Normal course and caliber of the major abdominal
vessels.

--No retroperitoneal lymphadenopathy.

--No mesenteric lymphadenopathy.

--No pelvic or inguinal lymphadenopathy.

Reproductive: Unremarkable

Other: There is a small volume of pelvic free fluid which is likely
physiologic. No free air. The abdominal wall is normal.

Musculoskeletal. No acute displaced fractures.
IMPRESSION: 1. No acute abdominopelvic abnormality.
2. Nonobstructive right nephrolithiasis.
3. Normal appendix.
4. Small volume of pelvic free fluid is likely physiologic.

## 2022-03-21 ENCOUNTER — Other Ambulatory Visit: Payer: Self-pay | Admitting: Nurse Practitioner

## 2022-03-21 ENCOUNTER — Other Ambulatory Visit (HOSPITAL_COMMUNITY)
Admission: RE | Admit: 2022-03-21 | Discharge: 2022-03-21 | Disposition: A | Discharge status: Home / Self Care | Payer: Self-pay | Source: Ambulatory Visit | Attending: Nurse Practitioner | Admitting: Nurse Practitioner

## 2022-03-21 DIAGNOSIS — Z124 Encounter for screening for malignant neoplasm of cervix: Secondary | ICD-10-CM | POA: Insufficient documentation

## 2022-03-25 LAB — CYTOLOGY - PAP
Comment: NEGATIVE
Diagnosis: NEGATIVE
Diagnosis: REACTIVE
High risk HPV: NEGATIVE
